# Patient Record
Sex: Female | Born: 1990 | Hispanic: No | Marital: Single | Smoking: Never smoker
Health system: Southern US, Community
[De-identification: ages and names within clinical notes are randomized; demographics above are authoritative.]

## PROBLEM LIST (undated history)

## (undated) DIAGNOSIS — A6000 Herpesviral infection of urogenital system, unspecified: Secondary | ICD-10-CM

## (undated) DIAGNOSIS — F419 Anxiety disorder, unspecified: Secondary | ICD-10-CM

## (undated) DIAGNOSIS — B001 Herpesviral vesicular dermatitis: Secondary | ICD-10-CM

## (undated) HISTORY — DX: Herpesviral vesicular dermatitis: B00.1

## (undated) HISTORY — DX: Anxiety disorder, unspecified: F41.9

## (undated) HISTORY — DX: Herpesviral infection of urogenital system, unspecified: A60.00

---
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
## 2012-02-19 IMAGING — CT CT ABD-PELV W/ CM
1 of 2 series · 15 of 32 positions shown, 19 images · IV contrast (agent unspecified)
Comparison: None

CLINICAL DATA: Abdominal and pelvic pain.

CT ABDOMEN AND PELVIS WITH CONTRAST
TECHNIQUE: Multidetector CT imaging of the abdomen and pelvis was
performed following the standard protocol during bolus
administration of intravenous contrast.
Contrast: 100 ml intravenous [id number]

[Series 2: rtn ap with st · axial · 0.62mm/px · z∈[-432,-56]mm · 15 of 83 slices shown, 19 images]
[im 4/83  soft-tissue]
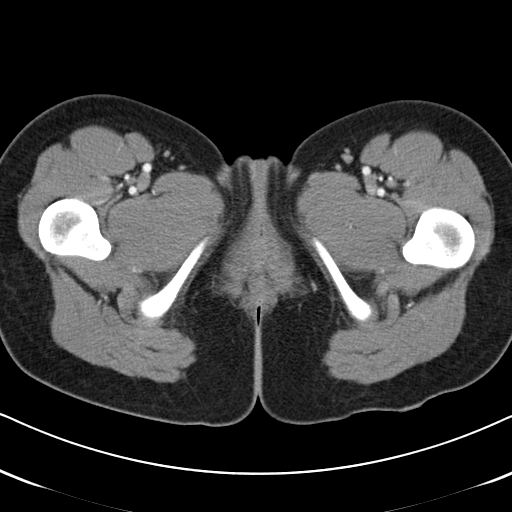
[im 4/83  bone]
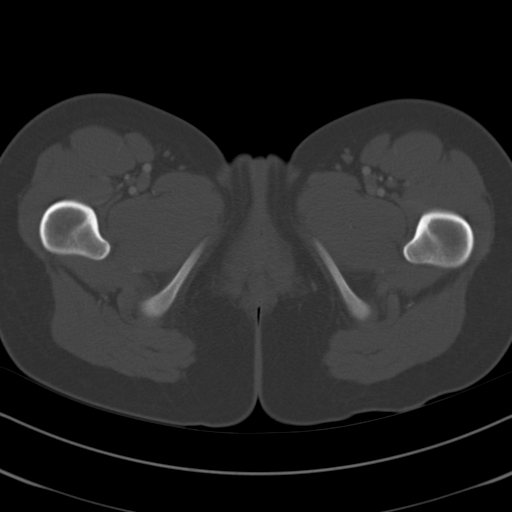
[im 10/83  soft-tissue]
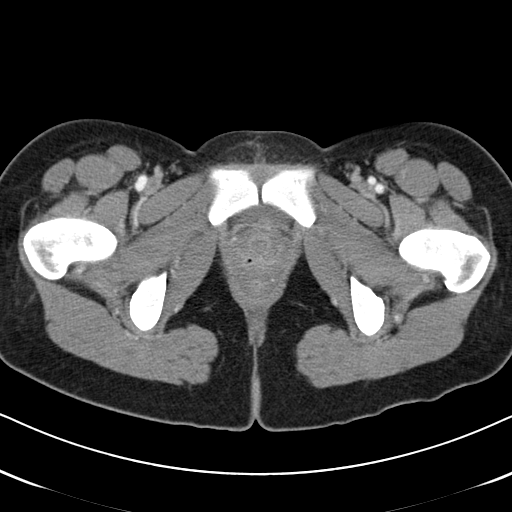
[im 17/83  soft-tissue]
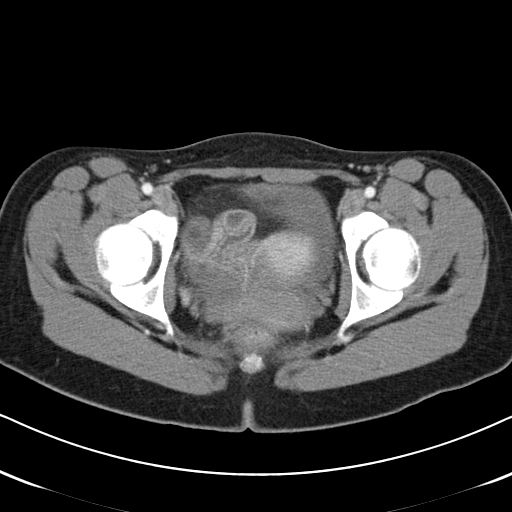
[im 23/83  soft-tissue]
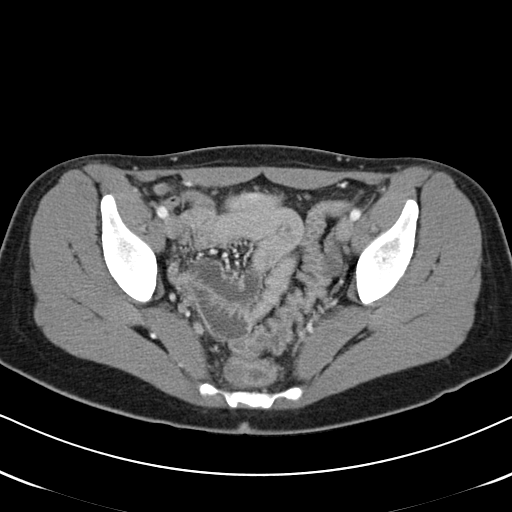
[im 30/83  soft-tissue]
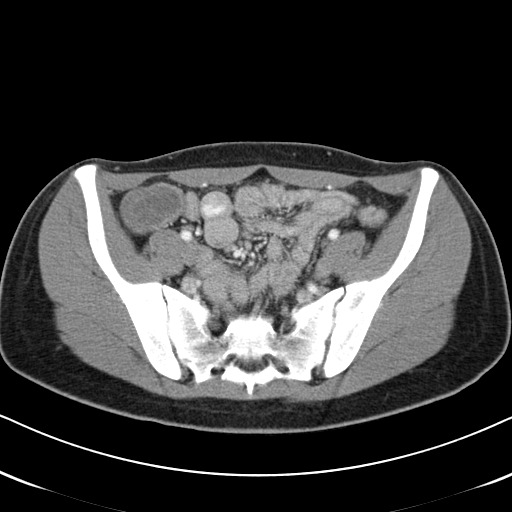
[im 37/83  soft-tissue]
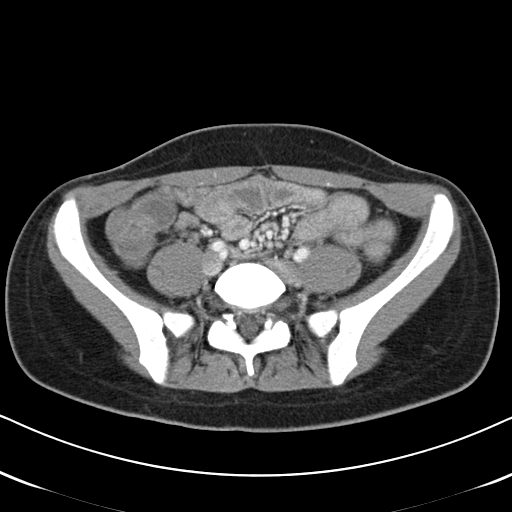
[im 43/83  soft-tissue]
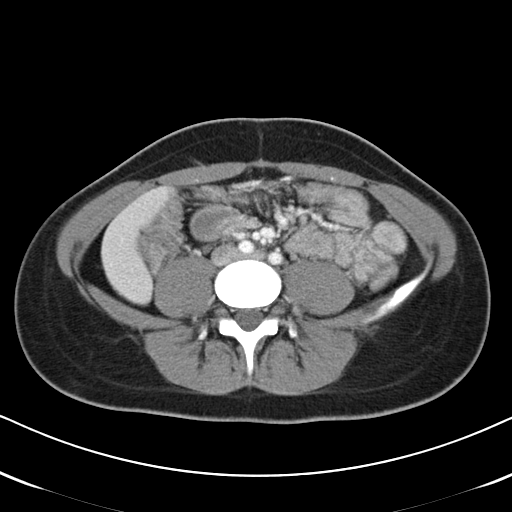
[im 46/83  soft-tissue]
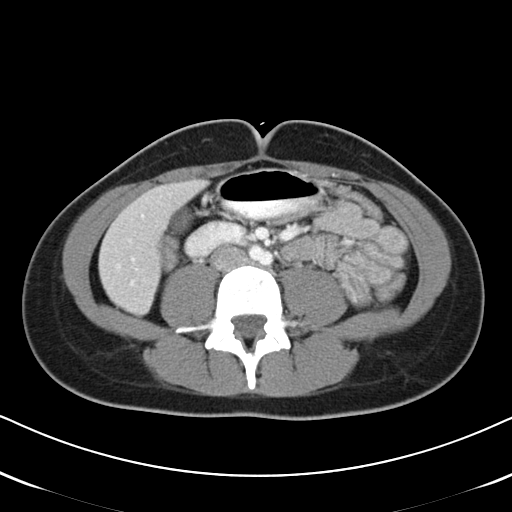
[im 53/83  soft-tissue]
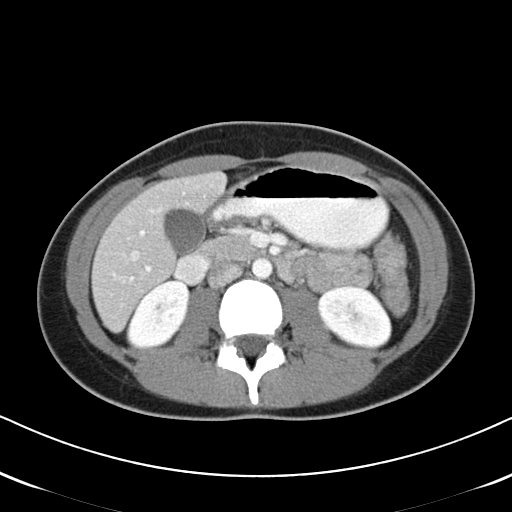
[im 53/83  bone]
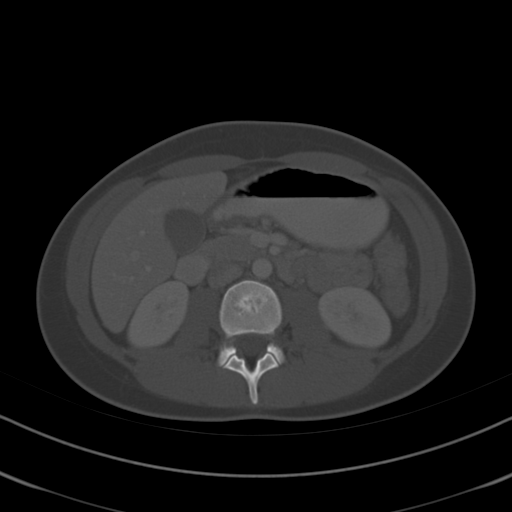
[im 60/83  soft-tissue]
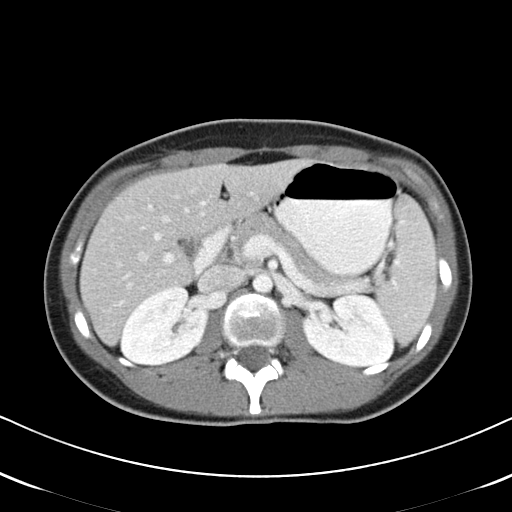
[im 66/83  soft-tissue]
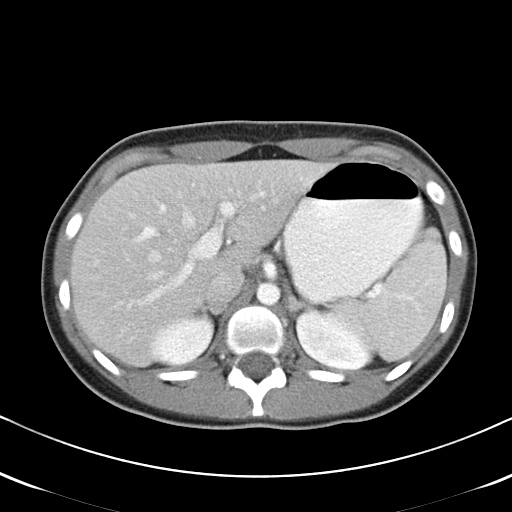
[im 69/83  lung]
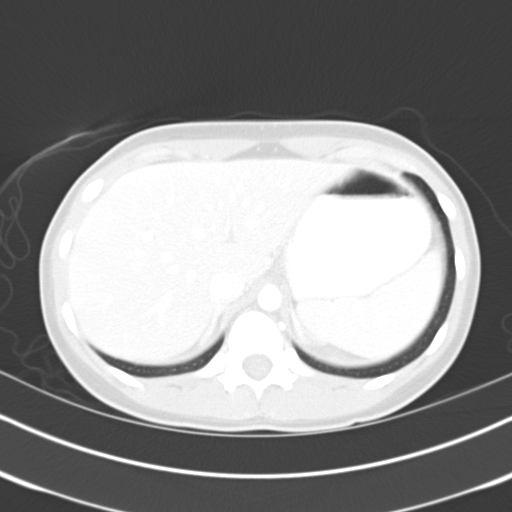
[im 73/83  soft-tissue]
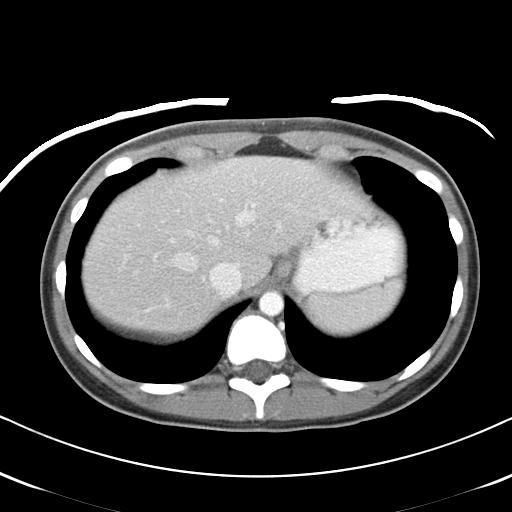
[im 73/83  lung]
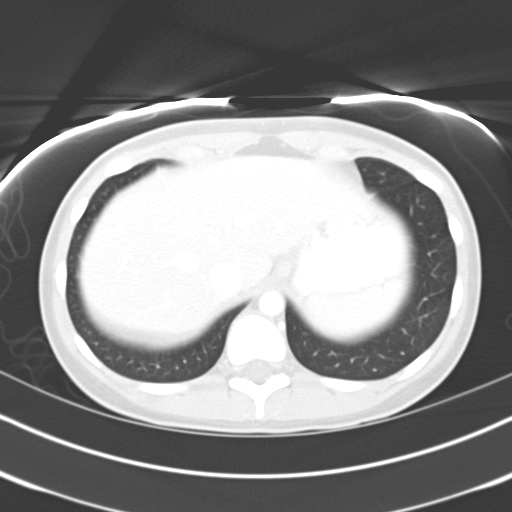
[im 76/83  lung]
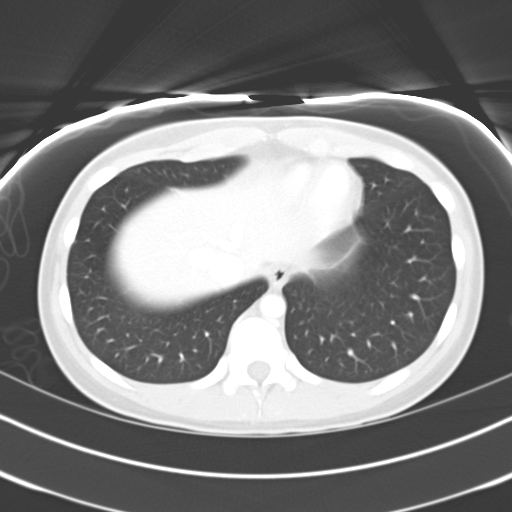
[im 79/83  soft-tissue]
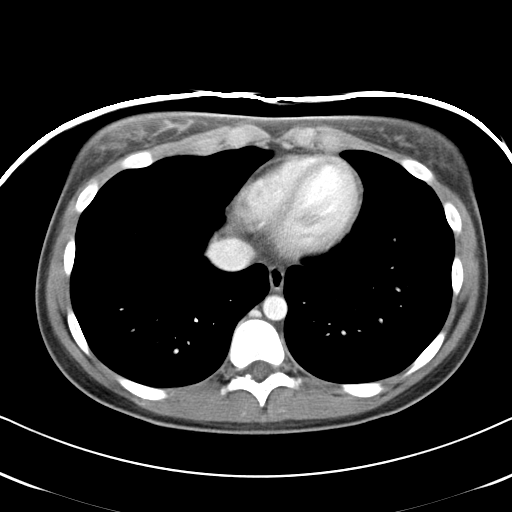
[im 79/83  lung]
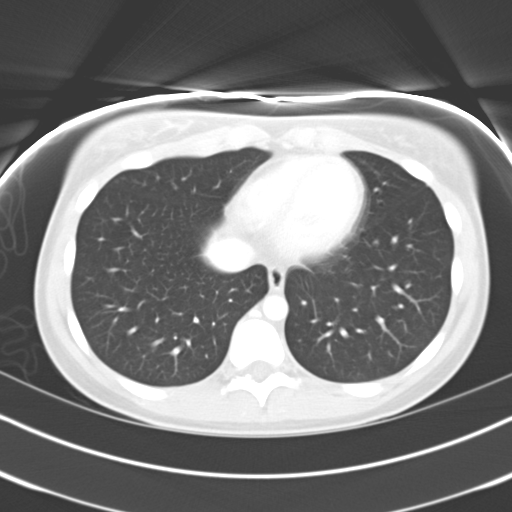

[15 of 32 positions shown; findings below may reference images not displayed]

FINDINGS: The liver, spleen, kidneys, adrenal glands, pancreas, and
gallbladder are unremarkable.

There is mild wall enhancement of much of the small bowel without
wall thickening or adjacent inflammation.  This may be normal or
represent a mild enteritis.

The colon and appendix are within normal limits.
No free fluid, enlarged lymph nodes, biliary dilation or abdominal
aortic aneurysm identified.
The bladder, uterus and adnexal regions are unremarkable.

Bilateral L5 pars defects are noted without evidence of
spondylolisthesis.
IMPRESSION: Mild small bowel wall enhancement without significant wall
thickening or inflammation - question normal versus mild enteritis.

No other acute abnormalities identified.

Bilateral L5 pars defects without spondylolisthesis.

## 2013-01-23 ENCOUNTER — Encounter (HOSPITAL_COMMUNITY): Payer: Self-pay | Admitting: Emergency Medicine

## 2013-01-23 ENCOUNTER — Emergency Department (HOSPITAL_COMMUNITY)
Admission: EM | Admit: 2013-01-23 | Discharge: 2013-01-23 | Disposition: A | Discharge status: Home / Self Care | Attending: Emergency Medicine | Admitting: Emergency Medicine

## 2013-01-23 DIAGNOSIS — F17200 Nicotine dependence, unspecified, uncomplicated: Secondary | ICD-10-CM | POA: Insufficient documentation

## 2013-01-23 DIAGNOSIS — F1010 Alcohol abuse, uncomplicated: Secondary | ICD-10-CM | POA: Insufficient documentation

## 2013-01-23 DIAGNOSIS — Z3202 Encounter for pregnancy test, result negative: Secondary | ICD-10-CM | POA: Insufficient documentation

## 2013-01-23 DIAGNOSIS — R112 Nausea with vomiting, unspecified: Secondary | ICD-10-CM | POA: Insufficient documentation

## 2013-01-23 DIAGNOSIS — R1084 Generalized abdominal pain: Secondary | ICD-10-CM | POA: Insufficient documentation

## 2013-01-23 LAB — PREGNANCY, URINE: Preg Test, Ur: NEGATIVE

## 2013-01-23 MED ORDER — SODIUM CHLORIDE 0.9 % IV BOLUS (SEPSIS)
1000.0000 mL | Freq: Once | INTRAVENOUS | Status: AC
  Administered 2013-01-23: 1000 mL via INTRAVENOUS

## 2013-01-23 MED ORDER — ONDANSETRON HCL 4 MG/2ML IJ SOLN
4.0000 mg | Freq: Once | INTRAMUSCULAR | Status: AC
Start: 2013-01-23 — End: 2013-01-23
  Administered 2013-01-23: 4 mg via INTRAVENOUS
  Filled 2013-01-23: qty 2

## 2013-01-23 MED ORDER — ONDANSETRON HCL 4 MG/2ML IJ SOLN
4.0000 mg | Freq: Once | INTRAMUSCULAR | Status: AC
  Administered 2013-01-23: 4 mg via INTRAVENOUS
  Filled 2013-01-23: qty 2

## 2013-01-23 MED ORDER — ONDANSETRON HCL 4 MG PO TABS: 4.0000 mg | ORAL_TABLET | Freq: Four times a day (QID) | ORAL | Status: DC

## 2013-01-23 NOTE — Progress Notes (Signed)
[facility] provided pt with a list of primary care resources.  °

## 2013-01-23 NOTE — Progress Notes (Signed)
   CARE MANAGEMENT ED NOTE 01/23/2013  Patient:  [facility]   Account Number:  [id number]  Date Initiated:  01/23/2013  Documentation initiated by:  Edd Arbour  Subjective/Objective Assessment:   22 yr old female self pay pt, no pcp, previously had tricare through her adopted father but reports being estranged from adopted father c/o too much to drink last night c/o possible alcohol poisoning     Subjective/Objective Assessment Detail:   tricare no longer active per EPIC registration verification entered     Action/Plan:   Cm reviewed EPIC noted pt seen by [facility] staff to offer a list of self pay [place] pcps Cm confirmed with pt that her tricare coverage is no  longer active per EPIC   Action/Plan Detail:   Anticipated DC Date:  01/23/2013     Status Recommendation to Physician:   Result of Recommendation:    Other ED Services  Consult Working Plan    DC Planning Services  Other  [facility] / [facility] (established/new)  Outpatient Services - Pt will follow up    Choice offered to / List presented to:            Status of service:  Completed, signed off  ED Comments:   ED Comments Detail:

## 2013-01-23 NOTE — ED Provider Notes (Addendum)
CSN: [id number]     Arrival date & time 01/23/13  1259 History   First MD Initiated Contact with Patient 01/23/13 1318     Chief Complaint  Patient presents with  . Alcohol Intoxication  . Nausea  . Emesis   (Consider location/radiation/quality/duration/timing/severity/associated sxs/prior Treatment) Patient is a 22 y.o. female presenting with intoxication and vomiting. The history is provided by the patient.  Alcohol Intoxication This is a new problem. The current episode started 6 to 12 hours ago. The problem has been gradually improving. Pertinent negatives include no chest pain, no headaches and no shortness of breath. She has tried rest for the symptoms.  Emesis Associated symptoms: no headaches     22 year old female with nausea and vomiting. Symptom onset late last night. Patient reports heavy drinking last night prior symptom onset. Mild diffuse abdominal pain. No diarrhea. No fevers or chills. No sick contacts. No urinary complaints. No recent travel history. Does not think she is pregnant. No intervention prior to arrival.   History reviewed. No pertinent past medical history. History reviewed. No pertinent past surgical history. No family history on file. History  Substance Use Topics  . Smoking status: Current Every Day Smoker  . Smokeless tobacco: Not on file  . Alcohol Use: Yes   OB History   Grav Para Term Preterm Abortions TAB SAB Ect Mult Living                 Review of Systems  Respiratory: Negative for shortness of breath.   Cardiovascular: Negative for chest pain.  Gastrointestinal: Positive for vomiting.  Neurological: Negative for headaches.    Allergies  Review of patient's allergies indicates not on file.   All systems reviewed and negative, other than as noted in HPI.  Home Medications   Current Outpatient Rx  Name  Route  Sig  Dispense  Refill  . OVER THE COUNTER MEDICATION   Both Eyes   Place 2 drops into both eyes 4 (four) times daily  as needed (For eye redness.). Rohto Cool Eye Drops (Naphazoline Hydrochloride 0.012%; Polysorbate 80 0.2%).         Marland Kitchen ondansetron (ZOFRAN) 4 MG tablet   Oral   Take 1 tablet (4 mg total) by mouth every 6 (six) hours.   12 tablet   0    BP 111/78  Pulse 87  Temp(Src) 97.2 F (36.2 C) (Oral)  Resp 17  SpO2 100% Physical Exam  Nursing note and vitals reviewed. Constitutional: She appears well-developed and well-nourished. No distress.  HENT:  Head: Normocephalic and atraumatic.  Eyes: Conjunctivae are normal. Right eye exhibits no discharge. Left eye exhibits no discharge.  Neck: Neck supple.  Cardiovascular: Normal rate, regular rhythm and normal heart sounds.  Exam reveals no gallop and no friction rub.   No murmur heard. Pulmonary/Chest: Effort normal and breath sounds normal. No respiratory distress.  Abdominal: Soft. She exhibits no distension. There is no tenderness.  Musculoskeletal: She exhibits no edema and no tenderness.  Neurological: She is alert.  Skin: Skin is warm and dry.  Psychiatric: She has a normal mood and affect. Her behavior is normal. Thought content normal.    ED Course  Procedures (including critical care time) Labs Review Labs Reviewed  PREGNANCY, URINE   Imaging Review No results found.  EKG Interpretation   None       MDM   1. Nausea and vomiting    22 year old female with nausea and vomiting. Suspect related to heavy alcohol  ingestion. Patient has benign abdominal exam. Hemodynamically stable. Very low suspicion for emergent etiology. Patient was treated symptomatically with IV fluids and Zofran. Symptoms significantly improved.     Raeford Razor, MD 01/23/13 1555  Raeford Razor, MD 01/23/13 1556

## 2013-01-23 NOTE — ED Notes (Signed)
Pt states that she had too much to drink last night. C/o of abd cramping, nausea, vomiting. Unable to keep anything down. "Think I have alcohol poisoning. "

## 2013-02-05 ENCOUNTER — Emergency Department (HOSPITAL_COMMUNITY)
Admission: EM | Admit: 2013-02-05 | Discharge: 2013-02-05 | Disposition: A | Discharge status: Home / Self Care | Payer: Self-pay | Attending: Emergency Medicine | Admitting: Emergency Medicine

## 2013-02-05 ENCOUNTER — Emergency Department (HOSPITAL_COMMUNITY): Payer: Self-pay

## 2013-02-05 ENCOUNTER — Encounter (HOSPITAL_COMMUNITY): Payer: Self-pay | Admitting: Emergency Medicine

## 2013-02-05 DIAGNOSIS — R51 Headache: Secondary | ICD-10-CM | POA: Insufficient documentation

## 2013-02-05 DIAGNOSIS — S022XXB Fracture of nasal bones, initial encounter for open fracture: Secondary | ICD-10-CM | POA: Insufficient documentation

## 2013-02-05 DIAGNOSIS — Z23 Encounter for immunization: Secondary | ICD-10-CM | POA: Insufficient documentation

## 2013-02-05 DIAGNOSIS — S0120XA Unspecified open wound of nose, initial encounter: Secondary | ICD-10-CM | POA: Insufficient documentation

## 2013-02-05 DIAGNOSIS — Y939 Activity, unspecified: Secondary | ICD-10-CM | POA: Insufficient documentation

## 2013-02-05 DIAGNOSIS — IMO0002 Reserved for concepts with insufficient information to code with codable children: Secondary | ICD-10-CM | POA: Insufficient documentation

## 2013-02-05 DIAGNOSIS — Y929 Unspecified place or not applicable: Secondary | ICD-10-CM | POA: Insufficient documentation

## 2013-02-05 MED ORDER — HYDROCODONE-ACETAMINOPHEN 5-325 MG PO TABS
1.0000 | ORAL_TABLET | Freq: Once | ORAL | Status: AC
  Administered 2013-02-05: 1 via ORAL
  Filled 2013-02-05: qty 1

## 2013-02-05 MED ORDER — HYDROCODONE-ACETAMINOPHEN 5-325 MG PO TABS: 1.0000 | ORAL_TABLET | ORAL | Status: DC | PRN

## 2013-02-05 MED ORDER — CEPHALEXIN 500 MG PO CAPS: 500.0000 mg | ORAL_CAPSULE | Freq: Four times a day (QID) | ORAL | Status: DC

## 2013-02-05 MED ORDER — TETANUS-DIPHTH-ACELL PERTUSSIS 5-2.5-18.5 LF-MCG/0.5 IM SUSP
0.5000 mL | Freq: Once | INTRAMUSCULAR | Status: AC
  Administered 2013-02-05: 0.5 mL via INTRAMUSCULAR
  Filled 2013-02-05: qty 0.5
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
## 2013-02-05 NOTE — ED Provider Notes (Signed)
Medical screening examination/treatment/procedure(s) were performed by non-physician practitioner and as supervising physician I was immediately available for consultation/collaboration.  EKG Interpretation   None        Yechezkel Fertig, MD 02/05/13 2353 

## 2013-10-20 ENCOUNTER — Encounter (HOSPITAL_BASED_OUTPATIENT_CLINIC_OR_DEPARTMENT_OTHER): Payer: Self-pay | Admitting: Emergency Medicine

## 2013-10-20 ENCOUNTER — Emergency Department (HOSPITAL_BASED_OUTPATIENT_CLINIC_OR_DEPARTMENT_OTHER)
Admission: EM | Admit: 2013-10-20 | Discharge: 2013-10-20 | Disposition: A | Discharge status: Home / Self Care | Attending: Emergency Medicine | Admitting: Emergency Medicine

## 2013-10-20 DIAGNOSIS — Z79899 Other long term (current) drug therapy: Secondary | ICD-10-CM | POA: Insufficient documentation

## 2013-10-20 DIAGNOSIS — L0390 Cellulitis, unspecified: Secondary | ICD-10-CM

## 2013-10-20 DIAGNOSIS — L02519 Cutaneous abscess of unspecified hand: Secondary | ICD-10-CM | POA: Insufficient documentation

## 2013-10-20 DIAGNOSIS — L03119 Cellulitis of unspecified part of limb: Principal | ICD-10-CM

## 2013-10-20 DIAGNOSIS — L02419 Cutaneous abscess of limb, unspecified: Secondary | ICD-10-CM | POA: Insufficient documentation

## 2013-10-20 DIAGNOSIS — L0291 Cutaneous abscess, unspecified: Secondary | ICD-10-CM

## 2013-10-20 DIAGNOSIS — Z792 Long term (current) use of antibiotics: Secondary | ICD-10-CM | POA: Insufficient documentation

## 2013-10-20 MED ORDER — SULFAMETHOXAZOLE-TMP DS 800-160 MG PO TABS: 1.0000 | ORAL_TABLET | Freq: Two times a day (BID) | ORAL | Status: DC

## 2013-10-20 MED ORDER — CEPHALEXIN 500 MG PO CAPS: 500.0000 mg | ORAL_CAPSULE | Freq: Four times a day (QID) | ORAL | Status: DC

## 2013-10-20 MED ORDER — SULFAMETHOXAZOLE-TMP DS 800-160 MG PO TABS: 1.0000 | ORAL_TABLET | Freq: Once | ORAL | Status: DC

## 2013-10-20 MED ORDER — TRAMADOL HCL 50 MG PO TABS: 50.0000 mg | ORAL_TABLET | Freq: Once | ORAL | Status: DC

## 2013-10-20 MED ORDER — TRAMADOL HCL 50 MG PO TABS: 50.0000 mg | ORAL_TABLET | Freq: Four times a day (QID) | ORAL | Status: DC | PRN

## 2013-10-20 MED ORDER — CEPHALEXIN 250 MG PO CAPS: 500.0000 mg | ORAL_CAPSULE | Freq: Once | ORAL | Status: DC

## 2013-10-20 NOTE — Discharge Instructions (Signed)
°  Take your antibiotics as directed and to completion. You should never have any leftover antibiotics! Push fluids and stay well hydrated.   Do not hesitate to return to the Emergency Department for any new, worsening or concerning symptoms.   If you do not have a primary care doctor you can establish one at the   [facility]: [address] [place] [id number] [phone]  After you establish care. Let them know you were seen in the emergency room. They must obtain records for further management.    Abscess Care After An abscess (also called a boil or furuncle) is an infected area that contains a collection of pus. Signs and symptoms of an abscess include pain, tenderness, redness, or hardness, or you may feel a moveable soft area under your skin. An abscess can occur anywhere in the body. The infection may spread to surrounding tissues causing cellulitis. A cut (incision) by the surgeon was made over your abscess and the pus was drained out. Gauze may have been packed into the space to provide a drain that will allow the cavity to heal from the inside outwards. The boil may be painful for 5 to 7 days. Most people with a boil do not have high fevers. Your abscess, if seen early, may not have localized, and may not have been lanced. If not, another appointment may be required for this if it does not get better on its own or with medications. HOME CARE INSTRUCTIONS   Only take over-the-counter or prescription medicines for pain, discomfort, or fever as directed by your caregiver.  When you bathe, soak and then remove gauze or iodoform packs at least daily or as directed by your caregiver. You may then wash the wound gently with mild soapy water. Repack with gauze or do as your caregiver directs. SEEK IMMEDIATE MEDICAL CARE IF:   You develop increased pain, swelling, redness, drainage, or bleeding in the wound site.  You develop signs of generalized infection including  muscle aches, chills, fever, or a general ill feeling.  An oral temperature above 102 F (38.9 C) develops, not controlled by medication. See your caregiver for a recheck if you develop any of the symptoms described above. If medications (antibiotics) were prescribed, take them as directed. Document Released: 10/02/2004 Document Revised: 06/08/2011 Document Reviewed: 05/30/2007 [facility] Patient Information 2015 [place] [place]. This information is not intended to replace advice given to you by your health care provider. Make sure you discuss any questions you have with your health care provider.

## 2013-10-20 NOTE — ED Notes (Signed)
[facility], c/o abscess to rt thigh and wrist; states unable to afford rx's that was given

## 2013-10-20 NOTE — ED Notes (Signed)
Faxed script to [facility] at [phone], received fax confirmation that script went through at 2315, informed patient that fax went through

## 2013-10-20 NOTE — ED Provider Notes (Signed)
CSN: [id number]     Arrival date & time 10/20/13  2016 History   First MD Initiated Contact with Patient 10/20/13 2045     Chief Complaint  Patient presents with  . Abscess     (Consider location/radiation/quality/duration/timing/severity/associated sxs/prior Treatment) HPI  Gyneth Hubka is a 23 y.o. female complaining of painful swelling to right thigh and wrist. Patient states that she may have been bitten by a insect but can't specifically remember. Was seen for same complaint [facility] last night. States she was given a prescription for doxycycline but she cannot afford it. Patient denies prior similar exacerbations, fever, chills, nausea or vomiting. Issues otherwise healthy.  History reviewed. No pertinent past medical history. No past surgical history on file. No family history on file. History  Substance Use Topics  . Smoking status: Never Smoker   . Smokeless tobacco: Not on file  . Alcohol Use: Yes   OB History   Grav Para Term Preterm Abortions TAB SAB Ect Mult Living                 Review of Systems  10 systems reviewed and found to be negative, except as noted in the HPI.   Allergies  Review of patient's allergies indicates no known allergies.  Home Medications   Prior to Admission medications   Medication Sig Start Date End Date Taking? Authorizing Provider  cephALEXin (KEFLEX) 500 MG capsule Take 1 capsule (500 mg total) by mouth 4 (four) times daily. 02/05/13   Fayrene Helper, PA-C  cephALEXin (KEFLEX) 500 MG capsule Take 1 capsule (500 mg total) by mouth 4 (four) times daily. 10/20/13   Mija Effertz, PA-C  HYDROcodone-acetaminophen (NORCO/VICODIN) 5-325 MG per tablet Take 1 tablet by mouth every 4 (four) hours as needed for moderate pain. 02/05/13   Fayrene Helper, PA-C  OVER THE COUNTER MEDICATION Place 2 drops into both eyes 4 (four) times daily as needed (For eye redness.). Rohto Cool Eye Drops (Naphazoline Hydrochloride 0.012%; Polysorbate 80 0.2%).     Historical Provider, MD  sulfamethoxazole-trimethoprim (BACTRIM DS) 800-160 MG per tablet Take 1 tablet by mouth 2 (two) times daily. 10/20/13   Alexza Norbeck, PA-C  traMADol (ULTRAM) 50 MG tablet Take 1 tablet (50 mg total) by mouth every 6 (six) hours as needed. 10/20/13   Kiera Hussey, PA-C   BP 110/74  Pulse 98  Temp(Src) 99.5 F (37.5 C) (Oral)  Resp 20  SpO2 100%  LMP 10/09/2013 Physical Exam  Nursing note and vitals reviewed. Constitutional: She is oriented to person, place, and time. She appears well-developed and well-nourished. No distress.  HENT:  Head: Normocephalic.  Mouth/Throat: Oropharynx is clear and moist.  Eyes: Conjunctivae and EOM are normal.  Cardiovascular: Normal rate, regular rhythm and intact distal pulses.   Pulmonary/Chest: Effort normal and breath sounds normal. No stridor.  Abdominal: Soft.  Musculoskeletal: Normal range of motion.  Neurological: She is alert and oriented to person, place, and time.  Skin:  Right anterior thigh with 3 lesions 2 of which are under 1 cm, the third is greater than 6 cm of cellulitis with central fluctuance. Patient also had a 5 mm lesion to right wrist. No drainage or warmth.  Psychiatric: She has a normal mood and affect.    ED Course  Procedures (including critical care time)  INCISION AND DRAINAGE Performed by: Wynetta Emery Consent: Verbal consent obtained. Risks and benefits: risks, benefits and alternatives were discussed Type: abscess  Body area: Right thigh  Anesthesia:  local infiltration  Incision was made with a scalpel.  Local anesthetic: lidocaine 2 % with epinephrine  Anesthetic total: 3 ml  Complexity: complex Blunt dissection to break up loculations  Drainage: purulent  Drainage amount: Moderate   Packing material: None   Patient tolerance: Patient tolerated the procedure well with no immediate complications.    Labs Review Labs Reviewed - No data to display  Imaging  Review No results found.   EKG Interpretation None      MDM   Final diagnoses:  Abscess and cellulitis    Filed Vitals:   10/20/13 2029 10/20/13 2151  BP: 114/62 110/74  Pulse: 100 98  Temp: 99.5 F (37.5 C)   TempSrc: Oral   Resp: 20 20  SpO2: 97% 100%    Medications  traMADol (ULTRAM) tablet 50 mg (50 mg Oral Not Given 10/20/13 2159)  cephALEXin (KEFLEX) capsule 500 mg (500 mg Oral Not Given 10/20/13 2159)  sulfamethoxazole-trimethoprim (BACTRIM DS) 800-160 MG per tablet 1 tablet (1 tablet Oral Not Given 10/20/13 2200)    Haskell RilingYamila Molner is a 23 y.o. female presenting with abscess and cellulitis to right side, smaller lesions scattered on the thigh and right wrist. Incision and drainage performed. Area of cellulitis outlined. Patient states she was seen for similar yesterday and given prescription for doxycycline which is too expensive. I will write her prescription for Keflex and Bactrim. It instructed her on the cheeks places to obtain medications. We have discussed return precautions and wound care.   Evaluation does not show pathology that would require ongoing emergent intervention or inpatient treatment. Pt is hemodynamically stable and mentating appropriately. Discussed findings and plan with patient/guardian, who agrees with care plan. All questions answered. Return precautions discussed and outpatient follow up given.   Discharge Medication List as of 10/20/2013  9:31 PM    START taking these medications   Details  !! cephALEXin (KEFLEX) 500 MG capsule Take 1 capsule (500 mg total) by mouth 4 (four) times daily., Starting 10/20/2013, Until Discontinued, Print    sulfamethoxazole-trimethoprim (BACTRIM DS) 800-160 MG per tablet Take 1 tablet by mouth 2 (two) times daily., Starting 10/20/2013, Until Discontinued, Print    traMADol (ULTRAM) 50 MG tablet Take 1 tablet (50 mg total) by mouth every 6 (six) hours as needed., Starting 10/20/2013, Until Discontinued, Print     !!  - Potential duplicate medications found. Please discuss with provider.           Wynetta Emeryicole Prospero Mahnke, PA-C 10/20/13 2218

## 2013-10-20 NOTE — ED Notes (Signed)
Pt  Called stating that they would not fill her tramadol because the prescription was not signed, attempting to call [facility] to see if we can call in the prescription or fax it to them so that patient does not have to come back to get a script

## 2013-10-20 NOTE — ED Notes (Signed)
ED PA at bedside
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
## 2013-10-20 NOTE — ED Provider Notes (Signed)
Medical screening examination/treatment/procedure(s) were performed by non-physician practitioner and as supervising physician I was immediately available for consultation/collaboration.   EKG Interpretation None        Rylie Knierim, MD 10/20/13 2321 

## 2016-06-13 ENCOUNTER — Ambulatory Visit (HOSPITAL_COMMUNITY)
Admission: EM | Admit: 2016-06-13 | Discharge: 2016-06-13 | Disposition: A | Discharge status: Home / Self Care | Attending: Family Medicine | Admitting: Family Medicine

## 2016-06-13 ENCOUNTER — Encounter (HOSPITAL_COMMUNITY): Payer: Self-pay | Admitting: *Deleted

## 2016-06-13 DIAGNOSIS — J029 Acute pharyngitis, unspecified: Secondary | ICD-10-CM

## 2016-06-13 NOTE — ED Provider Notes (Signed)
  [facility]    CSN: [id number] Arrival date & time: 06/13/16  1206     History   Chief Complaint Chief Complaint  Patient presents with  . Sore Throat  . Generalized Body Aches    HPI Pamela Melton is a 26 y.o. female.   HPI Pt with 3 days of ST and body aches. She does not have a fever. She is having a cough. Denies any ear pain/drainage, itchy/watery eyes, nasal congestion, rhinorrhea, or shortness of breath. She went to the chiropractor yesterday but did not notice an improvement of her muscle aches. She's been using ibuprofen sparingly. One of her coworkers was diagnosed strep.  History reviewed. No pertinent past medical history.  History reviewed. No pertinent surgical history.   Home Medications    Prior to Admission medications   Medication Sig Start Date End Date Taking? Authorizing Provider  OVER THE COUNTER MEDICATION Place 2 drops into both eyes 4 (four) times daily as needed (For eye redness.). Rohto Cool Eye Drops (Naphazoline Hydrochloride 0.012%; Polysorbate 80 0.2%).    Historical Provider, MD    Family History History reviewed. No pertinent family history.  Social History Social History  Substance Use Topics  . Smoking status: Never Smoker  . Alcohol use Yes     Allergies   Patient has no known allergies.   Review of Systems Review of Systems  HENT: Positive for sore throat.      Physical Exam Updated Vital Signs BP 105/70 (BP Location: Left Arm)   Pulse 72   Temp 98.6 F (37 C) (Oral)   Resp 14   SpO2 99%  Physical Exam  Constitutional: She appears well-developed and well-nourished.  HENT:  Head: Normocephalic and atraumatic.  Right Ear: External ear normal.  Left Ear: External ear normal.  Nose: Nose normal.  Mouth/Throat: Oropharynx is clear and moist. No oropharyngeal exudate.  Eyes: Conjunctivae and EOM are normal. Pupils are equal, round, and reactive to light.  Neck: Normal range of motion. Neck supple. No  thyromegaly present.  Cardiovascular: Normal rate and regular rhythm.   No murmur heard. Pulmonary/Chest: Effort normal and breath sounds normal. No respiratory distress.  Lymphadenopathy:    She has no cervical adenopathy.  Skin: She is not diaphoretic.  Psychiatric: She has a normal mood and affect. Judgment normal.     UC Treatments / Results  Procedures Procedures - none  Initial Impression / Assessment and Plan / UC Course  I have reviewed the triage vital signs and the nursing notes.  Pertinent labs & imaging results that were available during my care of the patient were reviewed by me and considered in my medical decision making (see chart for details).     Pt presents with ST. 0/4 Centor Criteria. Given her hx, I did offer to swab, but after hearing the explanation of the Centor Criteria and the low likelihood of this being strep throat, she declined. Recommended ibuprofen, acetaminophen and salt water gargles. Continue to practice good hygiene. Seek care if new signs/symptoms arise. Follow up with PCP as needed otherwise. The patient voiced understanding and agreement to the plan.  Final Clinical Impressions(s) / UC Diagnoses   Final diagnoses:  Viral pharyngitis     Jilda Rocheicholas Paul [place], DO 06/13/16 1723

## 2016-06-13 NOTE — ED Triage Notes (Addendum)
Patient reports about 3 days ago she started having sore throat and painful swallowing. States she now has entire upper chest soreness and stiffness. Went to chiropractor to see if that would help but reports no relief. Denies fevers. Reports she hasn't been able to sleep due to discomfort.

## 2016-06-13 NOTE — Discharge Instructions (Signed)
Continue to push fluids, practice good hand hygiene, and cover your mouth if you cough.  If you start having fevers, shaking or shortness of breath, seek immediate care.  Ibuprofen 400-600 mg (2-3 tabs) every 6 hours as needed for throat pain. You can use Tylenol 975 or 1000 mg (3 regular strength tabs or 2 extra strength tabs) every 6 hours as well.

## 2016-12-04 ENCOUNTER — Other Ambulatory Visit: Payer: Self-pay | Admitting: Internal Medicine

## 2016-12-04 ENCOUNTER — Encounter: Payer: Self-pay | Admitting: Internal Medicine

## 2016-12-04 ENCOUNTER — Ambulatory Visit (INDEPENDENT_AMBULATORY_CARE_PROVIDER_SITE_OTHER): Payer: Self-pay | Admitting: Internal Medicine

## 2016-12-04 VITALS — BP 96/50 | HR 70 | Temp 98.1°F | Resp 12 | Ht 62.0 in | Wt 112.0 lb

## 2016-12-04 DIAGNOSIS — R634 Abnormal weight loss: Secondary | ICD-10-CM

## 2016-12-04 DIAGNOSIS — R630 Anorexia: Secondary | ICD-10-CM

## 2016-12-04 DIAGNOSIS — N898 Other specified noninflammatory disorders of vagina: Secondary | ICD-10-CM

## 2016-12-04 DIAGNOSIS — Z124 Encounter for screening for malignant neoplasm of cervix: Secondary | ICD-10-CM

## 2016-12-04 DIAGNOSIS — R112 Nausea with vomiting, unspecified: Secondary | ICD-10-CM | POA: Insufficient documentation

## 2016-12-04 DIAGNOSIS — F419 Anxiety disorder, unspecified: Secondary | ICD-10-CM | POA: Insufficient documentation

## 2016-12-04 DIAGNOSIS — A6000 Herpesviral infection of urogenital system, unspecified: Secondary | ICD-10-CM

## 2016-12-04 LAB — POCT WET PREP WITH KOH
Clue Cells Wet Prep HPF POC: NEGATIVE
KOH PREP POC: NEGATIVE
RBC Wet Prep HPF POC: NEGATIVE
Trichomonas, UA: NEGATIVE
YEAST WET PREP PER HPF POC: NEGATIVE

## 2016-12-04 MED ORDER — NORGESTIM-ETH ESTRAD TRIPHASIC 0.18/0.215/0.25 MG-35 MCG PO TABS: 1.0000 | ORAL_TABLET | Freq: Every day | ORAL | 11 refills | Status: AC
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
## 2016-12-05 LAB — COMPREHENSIVE METABOLIC PANEL
ALBUMIN: 4.3 g/dL (ref 3.5–5.5)
ALT: 21 IU/L (ref 0–32)
AST: 17 IU/L (ref 0–40)
Albumin/Globulin Ratio: 1.5 (ref 1.2–2.2)
Alkaline Phosphatase: 61 IU/L (ref 39–117)
BUN/Creatinine Ratio: 14 (ref 9–23)
BUN: 11 mg/dL (ref 6–20)
Bilirubin Total: 0.3 mg/dL (ref 0.0–1.2)
CO2: 24 mmol/L (ref 20–29)
CREATININE: 0.79 mg/dL (ref 0.57–1.00)
Calcium: 9.6 mg/dL (ref 8.7–10.2)
Chloride: 101 mmol/L (ref 96–106)
GFR calc Af Amer: 120 mL/min/{1.73_m2} (ref >59)
GFR calc non Af Amer: 104 mL/min/{1.73_m2} (ref >59)
Globulin, Total: 2.9 g/dL (ref 1.5–4.5)
Glucose: 76 mg/dL (ref 65–99)
Potassium: 4.2 mmol/L (ref 3.5–5.2)
Sodium: 140 mmol/L (ref 134–144)
Total Protein: 7.2 g/dL (ref 6.0–8.5)

## 2016-12-05 LAB — TSH: TSH: 0.69 u[IU]/mL (ref 0.450–4.500)

## 2016-12-05 LAB — CBC WITH DIFFERENTIAL/PLATELET
Basophils Absolute: 0 10*3/uL (ref 0.0–0.2)
Basos: 0 %
EOS (ABSOLUTE): 0.1 10*3/uL (ref 0.0–0.4)
Eos: 1 %
HEMOGLOBIN: 13.1 g/dL (ref 11.1–15.9)
Hematocrit: 39.6 % (ref 34.0–46.6)
Immature Grans (Abs): 0 10*3/uL (ref 0.0–0.1)
Immature Granulocytes: 0 %
Lymphocytes Absolute: 2.3 10*3/uL (ref 0.7–3.1)
Lymphs: 33 %
MCH: 29.6 pg (ref 26.6–33.0)
MCHC: 33.1 g/dL (ref 31.5–35.7)
MCV: 89 fL (ref 79–97)
Monocytes Absolute: 0.5 10*3/uL (ref 0.1–0.9)
Monocytes: 7 %
NEUTROS PCT: 59 %
Neutrophils Absolute: 4.1 10*3/uL (ref 1.4–7.0)
Platelets: 301 10*3/uL (ref 150–379)
RBC: 4.43 x10E6/uL (ref 3.77–5.28)
RDW: 13 % (ref 12.3–15.4)
WBC: 7 10*3/uL (ref 3.4–10.8)

## 2016-12-07 LAB — GC/CHLAMYDIA PROBE AMP
CHLAMYDIA, DNA PROBE: NEGATIVE
Neisseria gonorrhoeae by PCR: NEGATIVE

## 2016-12-07 LAB — CYTOLOGY - PAP

## 2016-12-25 ENCOUNTER — Telehealth: Payer: Self-pay | Admitting: Internal Medicine

## 2016-12-25 NOTE — Telephone Encounter (Signed)
Patient called stating received via My Chart lab results and will like Dr. Delrae Alfred to call her and review them with her to make sure everything is normal.  To Cherice for Advise.

## 2016-12-25 NOTE — Telephone Encounter (Signed)
Please notify patient Dr. Delrae Melton has not reviewed her labs yet, but once she does she will put her comments in the lab and send them to her as well.

## 2016-12-30 NOTE — Telephone Encounter (Signed)
Patient notified on 12/30/16. Patient also will like Dr. Delrae Alfred to know if she can review her lab sooner; patient stated this is the 2nd time she called regarding this matter and it's been a while since she had lab work done with no answer.

## 2016-12-30 NOTE — Telephone Encounter (Signed)
To Dr. Mulberry  

## 2016-12-31 NOTE — Telephone Encounter (Signed)
Called and discussed with patient yesterday--see notes from then

## 2017-02-08 ENCOUNTER — Ambulatory Visit: Payer: Self-pay | Admitting: Internal Medicine

## 2017-05-21 ENCOUNTER — Other Ambulatory Visit: Payer: Self-pay

## 2017-05-21 ENCOUNTER — Encounter (HOSPITAL_COMMUNITY): Payer: Self-pay

## 2017-05-21 ENCOUNTER — Emergency Department (HOSPITAL_COMMUNITY)
Admission: EM | Admit: 2017-05-21 | Discharge: 2017-05-22 | Disposition: A | Discharge status: Home / Self Care | Payer: Self-pay | Attending: Emergency Medicine | Admitting: Emergency Medicine

## 2017-05-21 DIAGNOSIS — R1030 Lower abdominal pain, unspecified: Secondary | ICD-10-CM | POA: Insufficient documentation

## 2017-05-21 DIAGNOSIS — Z79899 Other long term (current) drug therapy: Secondary | ICD-10-CM | POA: Insufficient documentation

## 2017-05-21 LAB — COMPREHENSIVE METABOLIC PANEL
ALK PHOS: 76 U/L (ref 38–126)
ALT: 18 U/L (ref 14–54)
AST: 21 U/L (ref 15–41)
Albumin: 4.1 g/dL (ref 3.5–5.0)
Anion gap: 11 (ref 5–15)
BUN: 16 mg/dL (ref 6–20)
CHLORIDE: 107 mmol/L (ref 101–111)
CO2: 19 mmol/L — ABNORMAL LOW (ref 22–32)
CREATININE: 1.3 mg/dL — AB (ref 0.44–1.00)
Calcium: 9.6 mg/dL (ref 8.9–10.3)
GFR calc non Af Amer: 57 mL/min — ABNORMAL LOW (ref >60)
Glucose, Bld: 104 mg/dL — ABNORMAL HIGH (ref 65–99)
Potassium: 3.8 mmol/L (ref 3.5–5.1)
SODIUM: 137 mmol/L (ref 135–145)
Total Bilirubin: 1.2 mg/dL (ref 0.3–1.2)
Total Protein: 7.3 g/dL (ref 6.5–8.1)

## 2017-05-21 LAB — CBC WITH DIFFERENTIAL/PLATELET
BASOS PCT: 0 %
Basophils Absolute: 0 10*3/uL (ref 0.0–0.1)
EOS ABS: 0.1 10*3/uL (ref 0.0–0.7)
EOS PCT: 1 %
HCT: 40.7 % (ref 36.0–46.0)
Hemoglobin: 13.9 g/dL (ref 12.0–15.0)
LYMPHS ABS: 1.3 10*3/uL (ref 0.7–4.0)
Lymphocytes Relative: 11 %
MCH: 29.1 pg (ref 26.0–34.0)
MCHC: 34.2 g/dL (ref 30.0–36.0)
MCV: 85.1 fL (ref 78.0–100.0)
MONO ABS: 1.3 10*3/uL — AB (ref 0.1–1.0)
MONOS PCT: 11 %
Neutro Abs: 9 10*3/uL — ABNORMAL HIGH (ref 1.7–7.7)
Neutrophils Relative %: 77 %
PLATELETS: 275 10*3/uL (ref 150–400)
RBC: 4.78 MIL/uL (ref 3.87–5.11)
RDW: 12.4 % (ref 11.5–15.5)
WBC: 11.7 10*3/uL — ABNORMAL HIGH (ref 4.0–10.5)

## 2017-05-21 LAB — I-STAT BETA HCG BLOOD, ED (MC, WL, AP ONLY)

## 2017-05-21 LAB — LIPASE, BLOOD: Lipase: 33 U/L (ref 11–51)

## 2017-05-21 MED ORDER — SODIUM CHLORIDE 0.9 % IV BOLUS (SEPSIS)
1000.0000 mL | Freq: Once | INTRAVENOUS | Status: AC
  Administered 2017-05-22: 1000 mL via INTRAVENOUS

## 2017-05-21 MED ORDER — HYDROCODONE-ACETAMINOPHEN 5-325 MG PO TABS
1.0000 | ORAL_TABLET | Freq: Once | ORAL | Status: AC
Start: 2017-05-21 — End: 2017-05-21
  Administered 2017-05-21: 1 via ORAL
  Filled 2017-05-21: qty 1

## 2017-05-21 MED ORDER — ONDANSETRON 4 MG PO TBDP
4.0000 mg | ORAL_TABLET | Freq: Once | ORAL | Status: AC
  Administered 2017-05-21: 4 mg via ORAL
  Filled 2017-05-21: qty 1

## 2017-05-21 NOTE — ED Provider Notes (Signed)
Patient placed in Quick Look pathway, seen and evaluated   Chief Complaint: Vaginal bleeding and lower abdominal pain  HPI:   Patient presents to the ED with complaints of lower abdominal pain that radiates to her back.  He states that the pain started last night and has acutely worsened this morning.  Patient reports associated nausea and projectile vomiting.  Unable to keep any p.o. fluids down today.  She reports a significant amount of vaginal bleeding.  States that she started her period approximately 3-4 days ago however this is not typical of her periods.  Patient denies being on the birth control.  Denies any concern for possible pregnancy.  Patient denies any associated vaginal discharge.  Denies any urinary symptoms.  Associated fever or change in bowel habits.  Recent travel to [place].  ROS: Vaginal bleeding, lower abdominal cramping, back pain, nausea, vomiting.  (one)  Physical Exam:   Gen: No distress  Neuro: Awake and Alert  Skin: Warm    Focused Exam: Patient appears uncomfortable due to pain.  She does have some generalized lower abdominal pain to palpation without any signs of peritonitis.  No rebound noted.  No CVA tenderness.  No significant signs of dehydration.  Oropharynx is moist.   Initiation of care has begun. The patient has been counseled on the process, plan, and necessity for staying for the completion/evaluation, and the remainder of the medical screening examination   Discussed with the patient that exiting the department prior to completion of the work-up is AMA and there is no guarantee that there are no emergency medical conditions present.    Rise MuLeaphart, Kenneth T, PA-C 05/21/17 1539    Tegeler, Canary Brimhristopher J, MD 05/22/17 [phone]

## 2017-05-21 NOTE — ED Notes (Signed)
Pt remains in waiting room. Updated on wait for treatment room. 

## 2017-05-21 NOTE — ED Triage Notes (Signed)
Pt presents to the ed with complaints of lower abdominal pain shooting up her back, nausea and projectile vomiting. Pt reports feeling dehydrated.

## 2017-05-22 ENCOUNTER — Emergency Department (HOSPITAL_COMMUNITY): Payer: Self-pay

## 2017-05-22 ENCOUNTER — Encounter (HOSPITAL_COMMUNITY): Payer: Self-pay | Admitting: Radiology

## 2017-05-22 LAB — URINALYSIS, ROUTINE W REFLEX MICROSCOPIC
Bilirubin Urine: NEGATIVE
Glucose, UA: NEGATIVE mg/dL
Ketones, ur: 5 mg/dL — AB
LEUKOCYTES UA: NEGATIVE
Nitrite: NEGATIVE
Protein, ur: NEGATIVE mg/dL
SPECIFIC GRAVITY, URINE: 1.014 (ref 1.005–1.030)
pH: 6 (ref 5.0–8.0)

## 2017-05-22 MED ORDER — IOPAMIDOL (ISOVUE-300) INJECTION 61%
INTRAVENOUS | Status: AC
  Filled 2017-05-22: qty 100

## 2017-05-22 MED ORDER — DICYCLOMINE HCL 20 MG PO TABS: 20.0000 mg | ORAL_TABLET | Freq: Two times a day (BID) | ORAL | 0 refills | Status: AC

## 2017-05-22 MED ORDER — ONDANSETRON HCL 4 MG/2ML IJ SOLN
4.0000 mg | Freq: Once | INTRAMUSCULAR | Status: AC
Start: 2017-05-22 — End: 2017-05-22
  Administered 2017-05-22: 4 mg via INTRAVENOUS
  Filled 2017-05-22: qty 2

## 2017-05-22 MED ORDER — ONDANSETRON 4 MG PO TBDP: 4.0000 mg | ORAL_TABLET | Freq: Three times a day (TID) | ORAL | 0 refills | Status: AC | PRN

## 2017-05-22 MED ORDER — MORPHINE SULFATE (PF) 4 MG/ML IV SOLN
4.0000 mg | Freq: Once | INTRAVENOUS | Status: AC
  Administered 2017-05-22: 4 mg via INTRAVENOUS
  Filled 2017-05-22: qty 1

## 2017-05-22 MED ORDER — IOPAMIDOL (ISOVUE-300) INJECTION 61%
INTRAVENOUS | Status: AC
  Administered 2017-05-22: 100 mL
  Filled 2017-05-22: qty 30

## 2017-05-22 NOTE — ED Provider Notes (Signed)
Bilateral lower abd pain to back/flank No fever No pelivc pain, vaginal d/ch bleeding UA pending  AKI, mild leukocytosis Getting Morphine, Zofran, pending CT abd/pelvis  Plan: anticipate d/ch home (pending CT results) Po challenge, pain status  Patient's CT normal with exception of mild small bowel wall thickening, ?enteritis. She reports pain is better. PO challenge offered and she is tolerating fluids by mouth without further vomiting.   She can be discharged home. Will Rx Bentyl, Zofran. Return precautions discussed.     Elpidio AnisUpstill, Twila Rappa, PA-C 05/22/17 0230    Geoffery Lyonselo, Douglas, MD 05/22/17 [phone]

## 2017-05-22 NOTE — Discharge Instructions (Signed)
Use medications as prescribed. Return to the emergency department if your symptoms worsen or change.

## 2017-05-22 NOTE — ED Provider Notes (Signed)
[facility] EMERGENCY DEPARTMENT Provider Note   CSN: [id number] Arrival date & time: 05/21/17  1458     History   Chief Complaint Chief Complaint  Patient presents with  . Abdominal Pain    HPI Pamela Melton is a 27 y.o. female with a history of genital herpes who presents to the emergency department with a chief complaint of abdominal pain, nausea, and vomiting.  The patient endorses constant, worsening sharp bilateral lower quadrant that radiates around to her bilateral back with nausea and 5-6 episodes of projectile vomiting that began last night.  She denies fever, chills, dysuria, hematuria, frequency, constipation, diarrhea, vaginal pain, itching, or discharge, chest pain, dyspnea, or rash.  No history of similar.  She reports that she began her menstrual cycle 3 days ago has a history of heavy periods.  She states that her usual periods last around 5 days, and she uses ~3 regular tampons daily.  She states that she has been changing her tampons almost hourly since the onset of her menstrual cycle.   She reports poor fluid intake since onset of symptoms. "I feel dehydrated."   Last bowel movement was this morning, soft and normal.  No known sick contacts.  The patient reports that she just recently returned from a trip to [place].  No camping.   She is currently sexually active with one female partner.   The history is provided by the patient. No language interpreter was used.    Past Medical History:  Diagnosis Date  . Anxiety    Was treated for ADHD when younger and treated with Adderall.  Feels this was not a true diagnosis  . Genital herpes 26 yo  . Recurrent cold sores     Patient Active Problem List   Diagnosis Date Noted  . Nausea and vomiting 12/04/2016  . Anxiety   . Genital herpes     History reviewed. No pertinent surgical history.  OB History    No data available       Home Medications    Prior to Admission medications     Medication Sig Start Date End Date Taking? Authorizing Provider  Norgestimate-Ethinyl Estradiol Triphasic (TRI-SPRINTEC) 0.18/0.215/0.25 MG-35 MCG tablet Take 1 tablet by mouth daily. 12/04/16   Julieanne Manson, MD    Family History Family History  Problem Relation Age of Onset  . Anxiety disorder Mother   . Asthma Mother     Social History Social History   Tobacco Use  . Smoking status: Never Smoker  . Smokeless tobacco: Never Used  Substance Use Topics  . Alcohol use: Yes    Comment: occasional glass of wine  . Drug use: No     Allergies   Patient has no known allergies.   Review of Systems Review of Systems  Constitutional: Negative for activity change, chills and fever.  HENT: Negative for congestion.   Respiratory: Negative for shortness of breath.   Cardiovascular: Negative for chest pain.  Gastrointestinal: Positive for abdominal pain, nausea and vomiting. Negative for anal bleeding, blood in stool, constipation and diarrhea.  Genitourinary: Positive for vaginal bleeding. Negative for dysuria and hematuria.  Musculoskeletal: Negative for back pain.  Skin: Negative for rash.  Allergic/Immunologic: Negative for immunocompromised state.  Neurological: Negative for dizziness, weakness, numbness and headaches.  Psychiatric/Behavioral: Negative for confusion.     Physical Exam Updated Vital Signs BP 98/68   Pulse 91   Temp 98.5 F (36.9 C) (Oral)   Resp 17   Ht  5\' 2"  (1.575 m)   Wt 52.2 kg (115 lb)   LMP 05/19/2017   SpO2 100%   BMI 21.03 kg/m   Physical Exam  Constitutional: No distress.  HENT:  Head: Normocephalic.  Eyes: Conjunctivae are normal.  Neck: Neck supple.  Cardiovascular: Normal rate, regular rhythm, normal heart sounds and intact distal pulses. Exam reveals no gallop and no friction rub.  No murmur heard. Pulmonary/Chest: Effort normal. No stridor. No respiratory distress. She has no wheezes. She has no rales. She exhibits no  tenderness.  Abdominal: Soft. She exhibits no distension and no mass. There is tenderness. There is no rebound and no guarding. No hernia.  Tender to palpation over the left lower quadrant and right lower quadrant without rebound or guarding.  She is tender to palpation over the bilateral lumbar paraspinal muscles.  No tenderness of her mid Burney's point.  Negative Murphy sign.  No peritoneal signs.  Abdomen is soft, nondistended.  Musculoskeletal: Normal range of motion. She exhibits no edema or deformity.  Neurological: She is alert.  Skin: Skin is warm. Capillary refill takes less than 2 seconds. No rash noted. She is not diaphoretic.  Psychiatric: Her behavior is normal.  Nursing note and vitals reviewed.  ED Treatments / Results  Labs (all labs ordered are listed, but only abnormal results are displayed) Labs Reviewed  COMPREHENSIVE METABOLIC PANEL - Abnormal; Notable for the following components:      Result Value   CO2 19 (*)    Glucose, Bld 104 (*)    Creatinine, Ser 1.30 (*)    GFR calc non Af Amer 57 (*)    All other components within normal limits  CBC WITH DIFFERENTIAL/PLATELET - Abnormal; Notable for the following components:   WBC 11.7 (*)    Neutro Abs 9.0 (*)    Monocytes Absolute 1.3 (*)    All other components within normal limits  URINALYSIS, ROUTINE W REFLEX MICROSCOPIC - Abnormal; Notable for the following components:   Color, Urine STRAW (*)    Hgb urine dipstick LARGE (*)    Ketones, ur 5 (*)    Bacteria, UA RARE (*)    Squamous Epithelial / LPF 0-5 (*)    All other components within normal limits  LIPASE, BLOOD  I-STAT BETA HCG BLOOD, ED ([facility], [facility], [facility] ONLY)    EKG  EKG Interpretation None       Radiology Ct Abdomen Pelvis W Contrast  Result Date: 05/22/2017 CLINICAL DATA:  Lower abdominal pain with nausea and vomiting EXAM: CT ABDOMEN AND PELVIS WITH CONTRAST TECHNIQUE: Multidetector CT imaging of the abdomen and pelvis was performed using the  standard protocol following bolus administration of intravenous contrast. CONTRAST:  100 mL Isovue-300 intravenous COMPARISON:  CT 06/06/2010 FINDINGS: Lower chest: No acute abnormality. Hepatobiliary: No focal liver abnormality is seen. No gallstones, gallbladder wall thickening, or biliary dilatation. Pancreas: Unremarkable. No pancreatic ductal dilatation or surrounding inflammatory changes. Spleen: Normal in size without focal abnormality. Adrenals/Urinary Tract: Adrenal glands are unremarkable. Kidneys are normal, without renal calculi, focal lesion, or hydronephrosis. Bladder is unremarkable. Stomach/Bowel: Stomach is within normal limits. Appendix appears normal. No evidence of bowel wall thickening, distention, or inflammatory changes. Diffuse fluid-filled small bowel. Vascular/Lymphatic: No significant vascular findings are present. No enlarged abdominal or pelvic lymph nodes. Reproductive: Uterus and bilateral adnexa are unremarkable. Other: No abdominal wall hernia or abnormality. No abdominopelvic ascites. 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 No significant listhesis. IMPRESSION: 1. No CT evidence for acute  intra-abdominal or pelvic abnormality. Negative appendix. 2. Diffuse fluid-filled small bowel without dilatation, could be due to enteritis. 3. Chronic bilateral pars defect at L5 Electronically Signed   By: Jasmine Pang M.D.   On: 05/22/2017 01:26    Procedures Procedures (including critical care time)  Medications Ordered in ED Medications  iopamidol (ISOVUE-300) 61 % injection (not administered)  ondansetron (ZOFRAN-ODT) disintegrating tablet 4 mg (4 mg Oral Given 05/21/17 1542)  HYDROcodone-acetaminophen (NORCO/VICODIN) 5-325 MG per tablet 1 tablet (1 tablet Oral Given 05/21/17 1542)  sodium chloride 0.9 % bolus 1,000 mL (1,000 mLs Intravenous New Bag/Given 05/22/17 0030)  ondansetron (ZOFRAN) injection 4 mg (4 mg Intravenous Given 05/22/17 0043)  morphine 4 MG/ML  injection 4 mg (4 mg Intravenous Given 05/22/17 0043)  iopamidol (ISOVUE-300) 61 % injection (100 mLs  Contrast Given 05/22/17 0050)     Initial Impression / Assessment and Plan / ED Course  I have reviewed the triage vital signs and the nursing notes.  Pertinent labs & imaging results that were available during my care of the patient were reviewed by me and considered in my medical decision making (see chart for details).     27 year old female history of genital herpes who presents to the emergency department with bilateral lower abdominal pain, nausea, and projectile vomiting, onset yesterday.  No constitutional symptoms. She is afebrile and hemodynamically stable in the ED.  On physical exam, she is tender to palpation in the bilateral lower quadrants without rebound or guarding.  No peritoneal signs.  She is nontoxic appearing.  Pain medication and antiemetics ordered.  CT abdomen pelvis and UA are pending.  At this time, doubt surgical abdomen. Patient care transferred to PA Upstill at the end of my shift. Patient presentation, ED course, and plan of care discussed with review of all pertinent labs and imaging. Please see his/her note for further details regarding further ED course and disposition.  Final Clinical Impressions(s) / ED Diagnoses   Final diagnoses:  None    ED Discharge Orders    None       Barkley Boards, PA-C 05/22/17 0154    Geoffery Lyons, MD 05/22/17 [phone]

## 2021-12-24 ENCOUNTER — Encounter: Payer: Self-pay | Admitting: Family Medicine

## 2022-08-16 ENCOUNTER — Other Ambulatory Visit: Payer: Self-pay

## 2022-08-16 ENCOUNTER — Emergency Department (HOSPITAL_BASED_OUTPATIENT_CLINIC_OR_DEPARTMENT_OTHER)
Admission: EM | Admit: 2022-08-16 | Discharge: 2022-08-16 | Disposition: A | Discharge status: Home / Self Care | Payer: Self-pay | Attending: Emergency Medicine | Admitting: Emergency Medicine

## 2022-08-16 ENCOUNTER — Encounter (HOSPITAL_BASED_OUTPATIENT_CLINIC_OR_DEPARTMENT_OTHER): Payer: Self-pay | Admitting: Emergency Medicine

## 2022-08-16 DIAGNOSIS — E860 Dehydration: Secondary | ICD-10-CM | POA: Insufficient documentation

## 2022-08-16 DIAGNOSIS — K2920 Alcoholic gastritis without bleeding: Secondary | ICD-10-CM | POA: Insufficient documentation

## 2022-08-16 DIAGNOSIS — R748 Abnormal levels of other serum enzymes: Secondary | ICD-10-CM | POA: Insufficient documentation

## 2022-08-16 LAB — CBC
HCT: 40.7 % (ref 36.0–46.0)
Hemoglobin: 13.8 g/dL (ref 12.0–15.0)
MCH: 29.5 pg (ref 26.0–34.0)
MCHC: 33.9 g/dL (ref 30.0–36.0)
MCV: 87 fL (ref 80.0–100.0)
Platelets: 181 10*3/uL (ref 150–400)
RBC: 4.68 MIL/uL (ref 3.87–5.11)
RDW: 12 % (ref 11.5–15.5)
WBC: 10.7 10*3/uL — ABNORMAL HIGH (ref 4.0–10.5)
nRBC: 0 % (ref 0.0–0.2)

## 2022-08-16 LAB — COMPREHENSIVE METABOLIC PANEL
ALT: 55 U/L — ABNORMAL HIGH (ref 0–44)
AST: 56 U/L — ABNORMAL HIGH (ref 15–41)
Albumin: 4.7 g/dL (ref 3.5–5.0)
Alkaline Phosphatase: 66 U/L (ref 38–126)
Anion gap: 18 — ABNORMAL HIGH (ref 5–15)
BUN: 20 mg/dL (ref 6–20)
CO2: 20 mmol/L — ABNORMAL LOW (ref 22–32)
Calcium: 9.3 mg/dL (ref 8.9–10.3)
Chloride: 100 mmol/L (ref 98–111)
Creatinine, Ser: 0.76 mg/dL (ref 0.44–1.00)
GFR, Estimated: >60 mL/min (ref >60)
Glucose, Bld: 81 mg/dL (ref 70–99)
Potassium: 3.6 mmol/L (ref 3.5–5.1)
Sodium: 138 mmol/L (ref 135–145)
Total Bilirubin: 0.5 mg/dL (ref 0.3–1.2)
Total Protein: 7.5 g/dL (ref 6.5–8.1)

## 2022-08-16 LAB — ETHANOL: Alcohol, Ethyl (B): <10 mg/dL (ref <10)

## 2022-08-16 LAB — LIPASE, BLOOD: Lipase: 11 U/L (ref 11–51)

## 2022-08-16 MED ORDER — ONDANSETRON HCL 4 MG/2ML IJ SOLN: 4.0000 mg | Freq: Four times a day (QID) | INTRAMUSCULAR | Status: DC | PRN

## 2022-08-16 MED ORDER — FAMOTIDINE 20 MG PO TABS: 20.0000 mg | ORAL_TABLET | Freq: Two times a day (BID) | ORAL | 0 refills | Status: AC

## 2022-08-16 MED ORDER — LACTATED RINGERS IV BOLUS
1000.0000 mL | Freq: Once | INTRAVENOUS | Status: AC
  Administered 2022-08-16: 1000 mL via INTRAVENOUS

## 2022-08-16 MED ORDER — ONDANSETRON HCL 4 MG/2ML IJ SOLN
4.0000 mg | Freq: Once | INTRAMUSCULAR | Status: AC
  Administered 2022-08-16: 4 mg via INTRAVENOUS
  Filled 2022-08-16: qty 2

## 2022-08-16 NOTE — ED Triage Notes (Signed)
Patient drank about 4 glasses wine, 2 Truly's. Feels dehydrated, drinking.

## 2022-08-16 NOTE — Discharge Instructions (Addendum)
I am glad you are feeling better. Labs were reassuring - no signs of pancreatitis. Zofran and fluids seemed to help your symptoms. I am sending you home with Pepcid which can help with symptoms of alcoholic gastritis.  Seek emergency care if experiencing new or worsening symptoms.

## 2022-08-16 NOTE — ED Provider Notes (Signed)
EMERGENCY DEPARTMENT AT [facility] Provider Note   CSN: [id number] Arrival date & time: 08/16/22  2130     History  No chief complaint on file.   Pamela Melton is a 32 y.o. female who presents to ED complaining of non-bloody vomiting x4hours and dehydration. Patient requesting fluids. Patient was at wedding last night when she drank 4 glasses of wine and 2 Truly's. Also complaining of epigastric pain.  Denies chest pain, SOB, syncope, hematemesis.    HPI     Home Medications Prior to Admission medications   Medication Sig Start Date End Date Taking? Authorizing Provider  famotidine (PEPCID) 20 MG tablet Take 1 tablet (20 mg total) by mouth 2 (two) times daily for 5 days. 08/16/22 08/21/22 Yes Valrie Hart F, PA-C  dicyclomine (BENTYL) 20 MG tablet Take 1 tablet (20 mg total) by mouth 2 (two) times daily. 05/22/17   Elpidio Anis, PA-C  Norgestimate-Ethinyl Estradiol Triphasic (TRI-SPRINTEC) 0.18/0.215/0.25 MG-35 MCG tablet Take 1 tablet by mouth daily. Patient not taking: Reported on 05/22/2017 12/04/16   Julieanne Manson, MD  ondansetron (ZOFRAN ODT) 4 MG disintegrating tablet Take 1 tablet (4 mg total) by mouth every 8 (eight) hours as needed for nausea or vomiting. 05/22/17   Elpidio Anis, PA-C      Allergies    Patient has no known allergies.    Review of Systems   Review of Systems  Gastrointestinal:  Positive for nausea and vomiting.    Physical Exam Updated Vital Signs BP 103/66   Pulse 90   Temp 97.9 F (36.6 C) (Oral)   Resp 20   SpO2 100%  Physical Exam Vitals and nursing note reviewed.  Constitutional:      General: She is not in acute distress.    Appearance: She is not ill-appearing, toxic-appearing or diaphoretic.  HENT:     Head: Normocephalic and atraumatic.     Mouth/Throat:     Pharynx: No oropharyngeal exudate or posterior oropharyngeal erythema.  Eyes:     General: No scleral icterus.       Right eye: No  discharge.        Left eye: No discharge.     Conjunctiva/sclera: Conjunctivae normal.  Cardiovascular:     Rate and Rhythm: Normal rate.     Pulses: Normal pulses.     Heart sounds: Normal heart sounds. No murmur heard. Pulmonary:     Effort: Pulmonary effort is normal. No respiratory distress.     Breath sounds: Normal breath sounds. No wheezing, rhonchi or rales.  Abdominal:     General: Abdomen is flat. Bowel sounds are normal. There is no distension.     Palpations: Abdomen is soft. There is no mass.     Comments: Epigastric tenderness  Musculoskeletal:     Right lower leg: No edema.     Left lower leg: No edema.  Skin:    General: Skin is warm and dry.     Findings: No rash.  Neurological:     General: No focal deficit present.     Mental Status: She is alert. Mental status is at baseline.  Psychiatric:        Mood and Affect: Mood normal.        Behavior: Behavior normal.     ED Results / Procedures / Treatments   Labs (all labs ordered are listed, but only abnormal results are displayed) Labs Reviewed  CBC - Abnormal; Notable for the following components:  Result Value   WBC 10.7 (*)    All other components within normal limits  COMPREHENSIVE METABOLIC PANEL - Abnormal; Notable for the following components:   CO2 20 (*)    AST 56 (*)    ALT 55 (*)    Anion gap 18 (*)    All other components within normal limits  ETHANOL  LIPASE, BLOOD    EKG None  Radiology No results found.  Procedures Procedures    Medications Ordered in ED Medications  ondansetron (ZOFRAN) injection 4 mg (has no administration in time range)  ondansetron (ZOFRAN) injection 4 mg (4 mg Intravenous Given 08/16/22 1103)  lactated ringers bolus 1,000 mL (1,000 mLs Intravenous New Bag/Given 08/16/22 1105)    ED Course/ Medical Decision Making/ A&P                             Medical Decision Making Amount and/or Complexity of Data Reviewed Labs:  ordered.  Risk Prescription drug management.   This patient presents to the ED for concern of nausea and emesis, this involves an extensive number of treatment options, and is acomplaint that carries with it a high risk of complications and morbidity.  The differential diagnosis includes acute viral gastroenteritis, pancreatitis, alcoholic gastritis, gastric bleeding    Co morbidities that complicate the patient evaluation        none    Lab Tests: I Ordered, and personally interpreted labs. The pertinent results include:   -CBC: No concern for anemia or leukocytosis -Lipase: within normal limits -CMP:  no concern for electrolyte abnormality; no concern for kidney damage. Liver enzymes mildly elevated -ETOH: <10    Problem List / ED Course / Critical interventions /Medication management          - Patient presenting to ED with non-bloody vomiting x4hours after drinking at wedding last night. Patient concerned for dehydration and requesting fluids. Educated patient on risks vs benefits of IV fluids. - patient reports feeling better after zofran and fluids. CBC, CMP, lipase reassuring. ETOH <10. Patient tolerating PO fluids without vomiting.  - Patient requesting to go home. Lipase within normal limits - less concerning for pancreatitis. Vomiting without blood - less concerning for gastric bleed vs Mallory-Weiss tear. I believe this vomiting was d/t patient's alcohol intake last night. -Prescribing patient Pepcid to help relieve alcoholic gastritis. -Patient afebrile with stable vital signs. Reporting symptoms have resolved. Ready for discharge. Provided patient with return precautions.  Staffed patient with Dr Karene Fry   Social Determinants of Health:        none           Final Clinical Impression(s) / ED Diagnoses Final diagnoses:  Dehydration  Acute alcoholic gastritis without hemorrhage    Rx / DC Orders ED Discharge Orders          Ordered    famotidine  (PEPCID) 20 MG tablet  2 times daily        08/16/22 1310              Dorthy Cooler, [place] 08/16/22 1319    Ernie Avena, MD 08/16/22 2011
